# Patient Record
Sex: Female | Born: 1935 | Race: Asian | Hispanic: No | Smoking: Never smoker
Health system: Southern US, Community
[De-identification: ages and names within clinical notes are randomized; demographics above are authoritative.]

## PROBLEM LIST (undated history)

## (undated) DIAGNOSIS — M1990 Unspecified osteoarthritis, unspecified site: Secondary | ICD-10-CM

## (undated) HISTORY — PX: JOINT REPLACEMENT: SHX530

---

## 2007-03-21 ENCOUNTER — Inpatient Hospital Stay (HOSPITAL_COMMUNITY): Admission: RE | Admit: 2007-03-21 | Discharge: 2007-03-25 | Payer: Self-pay | Admitting: Orthopedic Surgery

## 2008-10-18 ENCOUNTER — Encounter: Admission: RE | Admit: 2008-10-18 | Discharge: 2008-11-01 | Payer: Self-pay | Admitting: Orthopedic Surgery

## 2008-11-14 ENCOUNTER — Ambulatory Visit (HOSPITAL_COMMUNITY): Admission: EM | Admit: 2008-11-14 | Discharge: 2008-11-14 | Payer: Self-pay | Admitting: Emergency Medicine

## 2010-04-16 LAB — POCT I-STAT, CHEM 8
Creatinine, Ser: 0.5 mg/dL (ref 0.4–1.2)
Glucose, Bld: 101 mg/dL — ABNORMAL HIGH (ref 70–99)
Hemoglobin: 13.9 g/dL (ref 12.0–15.0)
Potassium: 4.1 mEq/L (ref 3.5–5.1)

## 2010-05-27 NOTE — Discharge Summary (Signed)
Tammie Willis, Tammie Willis          ACCOUNT NO.:  [id number]   MEDICAL RECORD NO.:  [id number]          PATIENT TYPE:  INP   LOCATION:  5037                         FACILITY:  [facility]   PHYSICIAN:  Harvie Junior, M.D.   DATE OF BIRTH:  1929-10-30   DATE OF ADMISSION:  03/21/2007  DATE OF DISCHARGE:  03/24/2007                               DISCHARGE SUMMARY   ADMITTING DIAGNOSES:  1. End-stage severe degenerative joint disease, right knee.  2. [place]  3. Moderate obesity.   DISCHARGE DIAGNOSES:  1. End-stage severe degenerative joint disease, right knee.  2. Status post [place] arthro[place]  3. Moderate obesity.  4. Preoperative urinary tract infection, mild.   PROCEDURES IN-HOSPITAL:  Right total knee arthroplasty, computer-  assisted, Jodi Geralds, M.D. March 21, 2007.   BRIEF HISTORY:  Ms. Teachey is a 77 year old female who presents our  office with a chief complaint of severe right knee pain, catching and  locking.  She had pain at night and pain with weightbearing.  We  obtained x-rays of the right knee which showed severe end-stage bone-on-  bone arthritis with marked osteophyte formation and spurring, with  subluxation of her knee joint.  She had had her left knee repaired, via  knee replacement in [place], where she is from, about 3 years ago and did  well with that.  It was felt based upon her clinical and radiographic  findings that she was a candidate for a right total knee arthroplasty,  and she is admitted for this.   PERTINENT LABORATORY STUDIES:  Hemoglobin on admission was 15.2,  hematocrit 44.4, WBCs 6.5.  On post-op day #1, her hemoglobin 11.3 with  hematocrit 33.0.  Post-op day #2, her hemoglobin was 9.7, hematocrit  28.7.  On post-op day #3 her hemoglobin was 9.9 with hematocrit 28.4.  BMET on admission showed no abnormalities, and this was repeated on post-  op day #1  and was within normal limits.  Pro-time on admission was 11.9  seconds with a INR of 0.9 and a PTT of 28, on Coumadin therapy daily.  Postoperatively, on the day of discharge, her pro-time was 22.0 seconds  with INR of 1.9.  Urinalysis on admission showed few bacteria with 3-6  WBCs per high-powered field.  No other abnormalities noted.  CMET on  admission showed no abnormalities.   HOSPITAL COURSE:  The patient and was taken to the operating room on  March 21, 2007 and underwent a right total knee arthroplasty, computer-  assisted as well, described in Dr. Luiz Blare' operative note.  Preoperatively, she was given 80 mg IV of gentamicin and 1 gram Ancef.  This was felt to take care of the few bacteria noted on her preoperative  urinalysis and to take care for knee prophylactically.  Postoperatively,  she was given IV fluids, put on a PCA morphine pump.  Physical therapy  was ordered for walker ambulation, weightbearing as tolerated the right,  and a CPM machine was used for right knee range of motion.  Oral  Percocet was ordered for pain control, and she was started on Coumadin,  anti-thrombolytic leg therapy per pharmacy protocol for DVT prophylaxis,  and she will need this x1 month.  On post-op day #1, she overall was  doing well.  She had moderate knee pain, was relieved with PCA morphine.  She got out of bed to the patient the chair with physical therapy and  was able to walk some in  the room.  On post-op day #2, her hemoglobin  was stable.  Her left knee dressing was changed, and the Hemovac drain  was pulled.  Her IV was converted to a saline lock, and her PCA morphine  pump was discontinued.  She continued to progress with physical therapy,  and on the date of discharge, which is March 24, 2007, she was afebrile  and her vital signs were stable.  Her left knee wound was benign.  Her  calf was soft and __________ was intact distally.  Her knee range of  motion was 0 degrees to 60  degrees on.  Her INR the day of discharge was  1.9 on Coumadin therapy.  She is discharged to a skilled nursing  facility.   ACTIVITY:  Her activity status to be weightbearing as tolerated on the  right, with a walker.   DIET:  She improved condition, will be on a regular diet.  She will need  daily physical therapy for walker ambulation, weightbearing as tolerated  on the right.  She will need a CPM machine used 8 hours per 24 hours x1  week, progressing 10 degrees daily up to 95 or 100 degrees.  She will  start at zero degrees to 60 degrees.  She will also need physical  therapy for aggressive range of motion of the right knee as tolerated.   DISCHARGE MEDICATIONS:  Medications at discharge will be:  1. Percocet 5 mg 1-2 tablets q.4 h. p.r.n. pain.  2. Coumadin per pharmacy protocol 1 tablet daily x1 month post-op for      DVT prophylaxis, shooting for an INR of 2.0.  She will need to wear      the knee immobilizer when in bed for about 5 days.  They can      discontinue it.  She will need her dressing changed to the right      knee every other day, and the wound painted with Betadine.  She      will need a follow up with Dr. Luiz Blare in his office, in      approximately 12 days, i.e. when she is 2 weeks post-op.   CONDITION ON DISCHARGE:  Improved.   Our office number if any to be contacted is [phone].      Marshia Ly, P.A.      Harvie Junior, M.D.  Electronically Signed    JB/MEDQ  D:  03/24/2007  T:  03/25/2007  Job:  [id number]

## 2010-05-27 NOTE — Op Note (Signed)
[name], Tammie Willis          ACCOUNT NO.:  [id number]   MEDICAL RECORD NO.:  [id number]          PATIENT TYPE:  INP   LOCATION:  5037                         FACILITY:  [facility]   PHYSICIAN:  Harvie Junior, M.D.   DATE OF BIRTH:  [birth year]   DATE OF PROCEDURE:  03/21/2007  DATE OF DISCHARGE:                               OPERATIVE REPORT   PREOPERATIVE DIAGNOSIS:  Severe end-stage degenerative joint disease of  right knee with severe malalignment.   POSTOPERATIVE DIAGNOSIS:  Same as above.   PROCEDURE:  Right total knee replacement with [employer] systems size 2  femur, size 2 tibia, 12.5 mm bridging bearing, and a 32 mm all  polyethylene patella.   SURGEON:  Harvie Junior, M.D.   ASSISTANTDeno Lunger.   ANESTHESIA:  General.   SECOND OPERATIVE PROCEDURE:  Computer-assisted right total knee  replacement.   SURGEON:  Harvie Junior, M.D.   ASSISTANTDeno Lunger.   ANESTHESIA:  General.   BRIEF HISTORY:  [name] is a 77 year old female with a long  history of having had severe bilateral degenerative joint disease.  She  underwent a left total knee in [place] some time back and has done  reasonably well with that.  She still is essentially wheelchair bound  relative to the severe nature of her deformity and pain in the right  knee, and because of continual complaints of pain, she was evaluated in  the office where she is noted to have severe end-stage degenerative  joint disease.  We talked about the treatment options and ultimately  felt that the most appropriate course of action probably was going to be  total knee replacement.  She was brought to the operating room for this  procedure.   PROCEDURE:  The patient was brought to the operating room and after  adequate anesthesia was obtained by general anesthetic, the patient was  placed on the operating table.  The right leg was prepped and draped in  the usual sterile fashion.  Following this, the knee was  exsanguinated,  and a blood pressure tourniquet was insufflated to 350 mmHg.  Following  this, a midline incision was made through subcutaneous tissue and taken  down to the level of the extensor mechanism.  Medial parapatellar  thrombus was undertaken.Medial and lateral meniscectomy as well as  anterior and posterior cruciate excision, retropatellar fat pad excision  were undertaken.  Once this was completed, attention was turned to the  computer-assistance modules  where two pins were placed in the tibia,  two pins in the femur, and the arrays were placed.  Once this was  completed, the attention was turned towards a long alignment.  The  patient at this point could be brought into neutral long alignment.  I  was not easily able to get her into valgus.  At that point, a larger  meatal release was undertaken, and once that was done, we could easily  get her into some valgus alignment.  At this point, attention was turned  toward the tibia, which was cut perpendicular to its long axis with the  computer-assistance modules.  The modules were placed, and the  registration process was undertaken.  Prior to this, it takes about 20  minutes for this surgery.  Once that was completed, the tibia was cut  perpendicular to its long axis, femur perpendicular to its long axis,  and then spacer blocks were put in place.  We could easily get a 10  spacer block into that extension gap.  Once that was completed,  attention was turned towards the sizing of the femur, and the femur was  sized to a size 2, and once thatwas completed the femur was then cut.  The anterior and posterior cuts were made to a size 2, and once that was  completed, the attention was turned towards the anterior and posterior  chamfers,  which were made as well with the box cut.  Attention was then  turned to the tibia which is sized to a 2.  It was rotationally aligned,  and it was keeled and drilled, and once that was completed,  the  attention was turned to the patella, which was cut perpendicular down to  a level of 14, and then a 32 was chosen.  It really was a tight fit for  a 32.  Obviously, did not have anything smaller, so that was used, and  it did relatively fit, but there was a little bit of overhang on both  the inferior and superior areas, but the pegs were well placed and was  going to be good for cement mantle.  Once this was completed, all the  trial components were removed out of the knee.  The knee was copiously  and thoroughly lavaged and suctioned dry and then sponges were used to  dry thoroughly and completely.  The cement was mixed on the back table,  and the final components were cemented into place.  A size 2 femur, a  size 2 tibia, a 10 mm bridging bearing, and a 32 mm all polyethylene  patella.  Once this was completed and the cement was allowed to harden  the tourniquet was let down, and the final poly was put into place.  Through flexion and extension, there was a little bit of a tendency  towards subluxation which is very concerning given the significant  rotational malalignment that she had preoperatively.  At this point, we  had trialed back and forth from a 10 to a 12.5.  It felt like the 10 was  going to be fine, but at this point with the rotary subluxation and  concern for that, we did take out some of our stitches of her medial  parapatellar arthrotomy and then change the 10 poly to a 12.5 poly.  This gave a little bit less of an anterior drawer, and I was not able to  get a rotational subluxation with this higher level of poly.  This did  leave her about 2 degrees shy of full extension, but I felt that this  was critical given the severe rotational issue that she had going into  her surgery.  Computer assistance showed [place] that we had perfect neutral  long alignment.  Gap balances were then a millimeter, and extension and  flexion were balanced.  At this point, the knee was  copiously and  thoroughly irrigated and suctioned dry.  All bleeding had been  controlled with electrocautery, and the tourniquet had been let down.  The knee was then closed with 1 Vicryl running in the medial  parapatellar arthrotomy, 2-0 Vicryl  in the subcu, and then skin staples.  A sterile compressive dressing was applied as well as a knee  immobilizer.  The patient was taken to the recovery room and was noted  to be in satisfactory condition.  The estimated blood loss for the  procedure was none.      Harvie Junior, M.D.  Electronically Signed     JLG/MEDQ  D:  03/21/2007  T:  03/22/2007  Job:  [id number]

## 2010-09-02 ENCOUNTER — Inpatient Hospital Stay (INDEPENDENT_AMBULATORY_CARE_PROVIDER_SITE_OTHER)
Admission: RE | Admit: 2010-09-02 | Discharge: 2010-09-02 | Disposition: A | Discharge status: Home / Self Care | Payer: Self-pay | Source: Ambulatory Visit | Attending: Family Medicine | Admitting: Family Medicine

## 2010-09-02 ENCOUNTER — Ambulatory Visit (INDEPENDENT_AMBULATORY_CARE_PROVIDER_SITE_OTHER): Payer: Self-pay

## 2010-09-02 DIAGNOSIS — J069 Acute upper respiratory infection, unspecified: Secondary | ICD-10-CM

## 2010-10-06 LAB — URINE MICROSCOPIC-ADD ON

## 2010-10-06 LAB — CBC
HCT: 28.4 — ABNORMAL LOW
HCT: 28.7 — ABNORMAL LOW
Hemoglobin: 11.3 — ABNORMAL LOW
Hemoglobin: 15.2 — ABNORMAL HIGH
Hemoglobin: 9.7 — ABNORMAL LOW
Hemoglobin: 9.9 — ABNORMAL LOW
MCHC: 34.2
MCHC: 34.4
MCV: 90.5
Platelets: 181
RBC: 3.14 — ABNORMAL LOW
RBC: 3.17 — ABNORMAL LOW
RBC: 3.64 — ABNORMAL LOW
RDW: 13.1
RDW: 13.2
WBC: 6.7
WBC: 9.7

## 2010-10-06 LAB — COMPREHENSIVE METABOLIC PANEL
ALT: 18
BUN: 13
Calcium: 9.7
Glucose, Bld: 99
Sodium: 138
Total Protein: 6.6

## 2010-10-06 LAB — BASIC METABOLIC PANEL
CO2: 28
Calcium: 8.8
Chloride: 104
GFR calc Af Amer: >60
Sodium: 139

## 2010-10-06 LAB — PROTIME-INR
INR: 0.9
INR: 1
INR: 2 — ABNORMAL HIGH
Prothrombin Time: 23.2 — ABNORMAL HIGH

## 2010-10-06 LAB — DIFFERENTIAL
Lymphocytes Relative: 31
Lymphs Abs: 2
Monocytes Relative: 9
Neutro Abs: 3.7
Neutrophils Relative %: 57

## 2010-10-06 LAB — URINALYSIS, ROUTINE W REFLEX MICROSCOPIC
Nitrite: NEGATIVE
Specific Gravity, Urine: 1.02
Urobilinogen, UA: 0.2

## 2010-10-06 LAB — ABO/RH: ABO/RH(D): AB POS

## 2010-10-06 LAB — APTT: aPTT: 28

## 2010-10-12 ENCOUNTER — Inpatient Hospital Stay (INDEPENDENT_AMBULATORY_CARE_PROVIDER_SITE_OTHER)
Admission: RE | Admit: 2010-10-12 | Discharge: 2010-10-12 | Disposition: A | Discharge status: Home / Self Care | Payer: Self-pay | Source: Ambulatory Visit | Attending: Emergency Medicine | Admitting: Emergency Medicine

## 2010-10-12 ENCOUNTER — Other Ambulatory Visit (HOSPITAL_COMMUNITY): Payer: Self-pay | Admitting: Emergency Medicine

## 2010-10-12 ENCOUNTER — Ambulatory Visit (HOSPITAL_COMMUNITY)
Admission: RE | Admit: 2010-10-12 | Discharge: 2010-10-12 | Disposition: A | Discharge status: Home / Self Care | Payer: Medicare Other | Source: Ambulatory Visit | Attending: Emergency Medicine | Admitting: Emergency Medicine

## 2010-10-12 DIAGNOSIS — R51 Headache: Secondary | ICD-10-CM

## 2010-10-12 DIAGNOSIS — R220 Localized swelling, mass and lump, head: Secondary | ICD-10-CM | POA: Insufficient documentation

## 2012-04-13 ENCOUNTER — Other Ambulatory Visit: Payer: Self-pay | Admitting: Cardiovascular Disease

## 2012-04-13 ENCOUNTER — Ambulatory Visit
Admission: RE | Admit: 2012-04-13 | Discharge: 2012-04-13 | Disposition: A | Discharge status: Home / Self Care | Payer: Medicare Other | Source: Ambulatory Visit | Attending: Cardiovascular Disease | Admitting: Cardiovascular Disease

## 2012-04-13 DIAGNOSIS — R52 Pain, unspecified: Secondary | ICD-10-CM

## 2012-04-18 ENCOUNTER — Other Ambulatory Visit: Payer: Self-pay | Admitting: Cardiovascular Disease

## 2012-04-19 ENCOUNTER — Ambulatory Visit
Admission: RE | Admit: 2012-04-19 | Discharge: 2012-04-19 | Disposition: A | Discharge status: Home / Self Care | Payer: Medicare Other | Source: Ambulatory Visit | Attending: Cardiovascular Disease | Admitting: Cardiovascular Disease

## 2014-08-05 IMAGING — CR DG CHEST 2V
2 series · 2 of 2 positions shown · non-contrast
Comparison: Chest x-ray of 09/02/2010

CLINICAL DATA: Chest pain, history of cardiac disease

CHEST - 2 VIEW

[w chest pa]
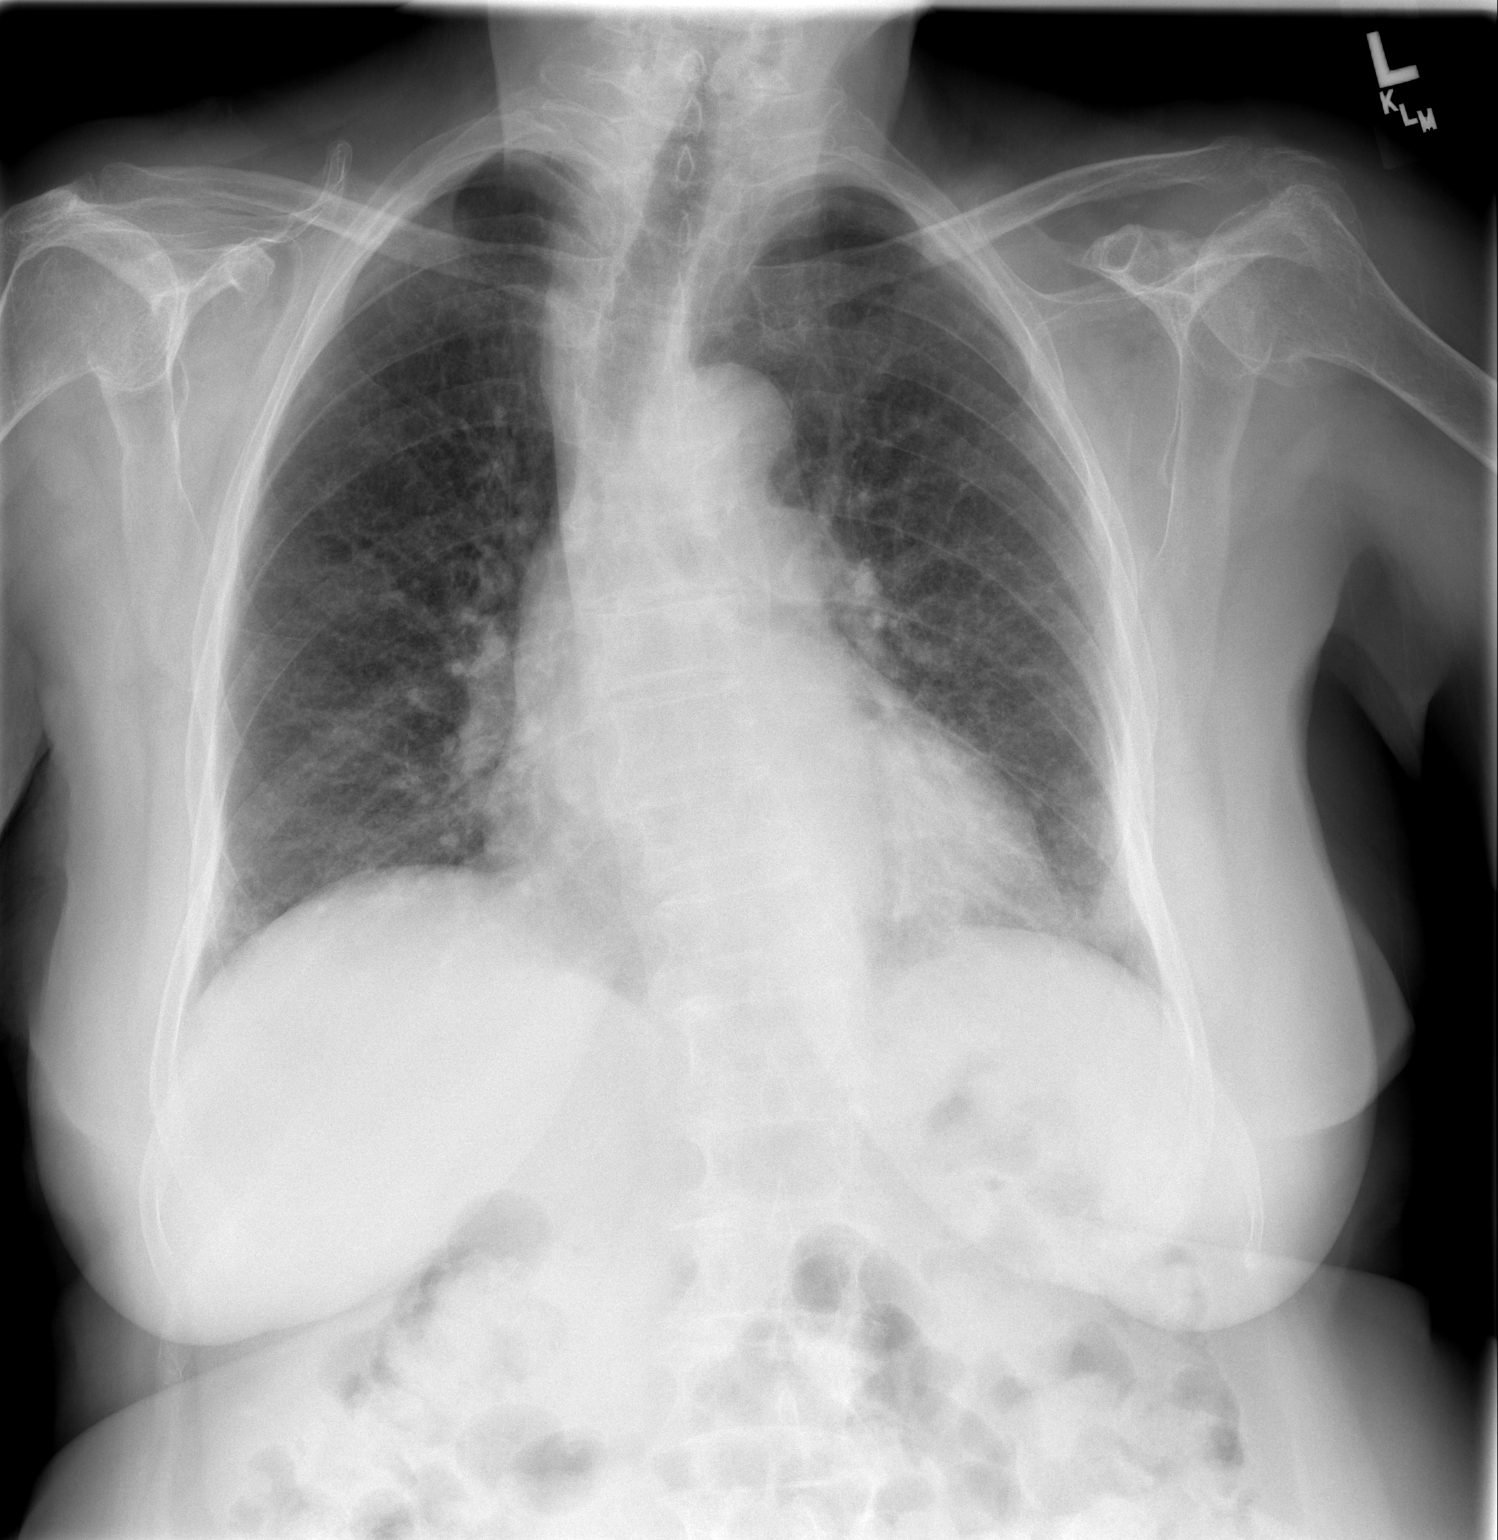

[w chest lat]
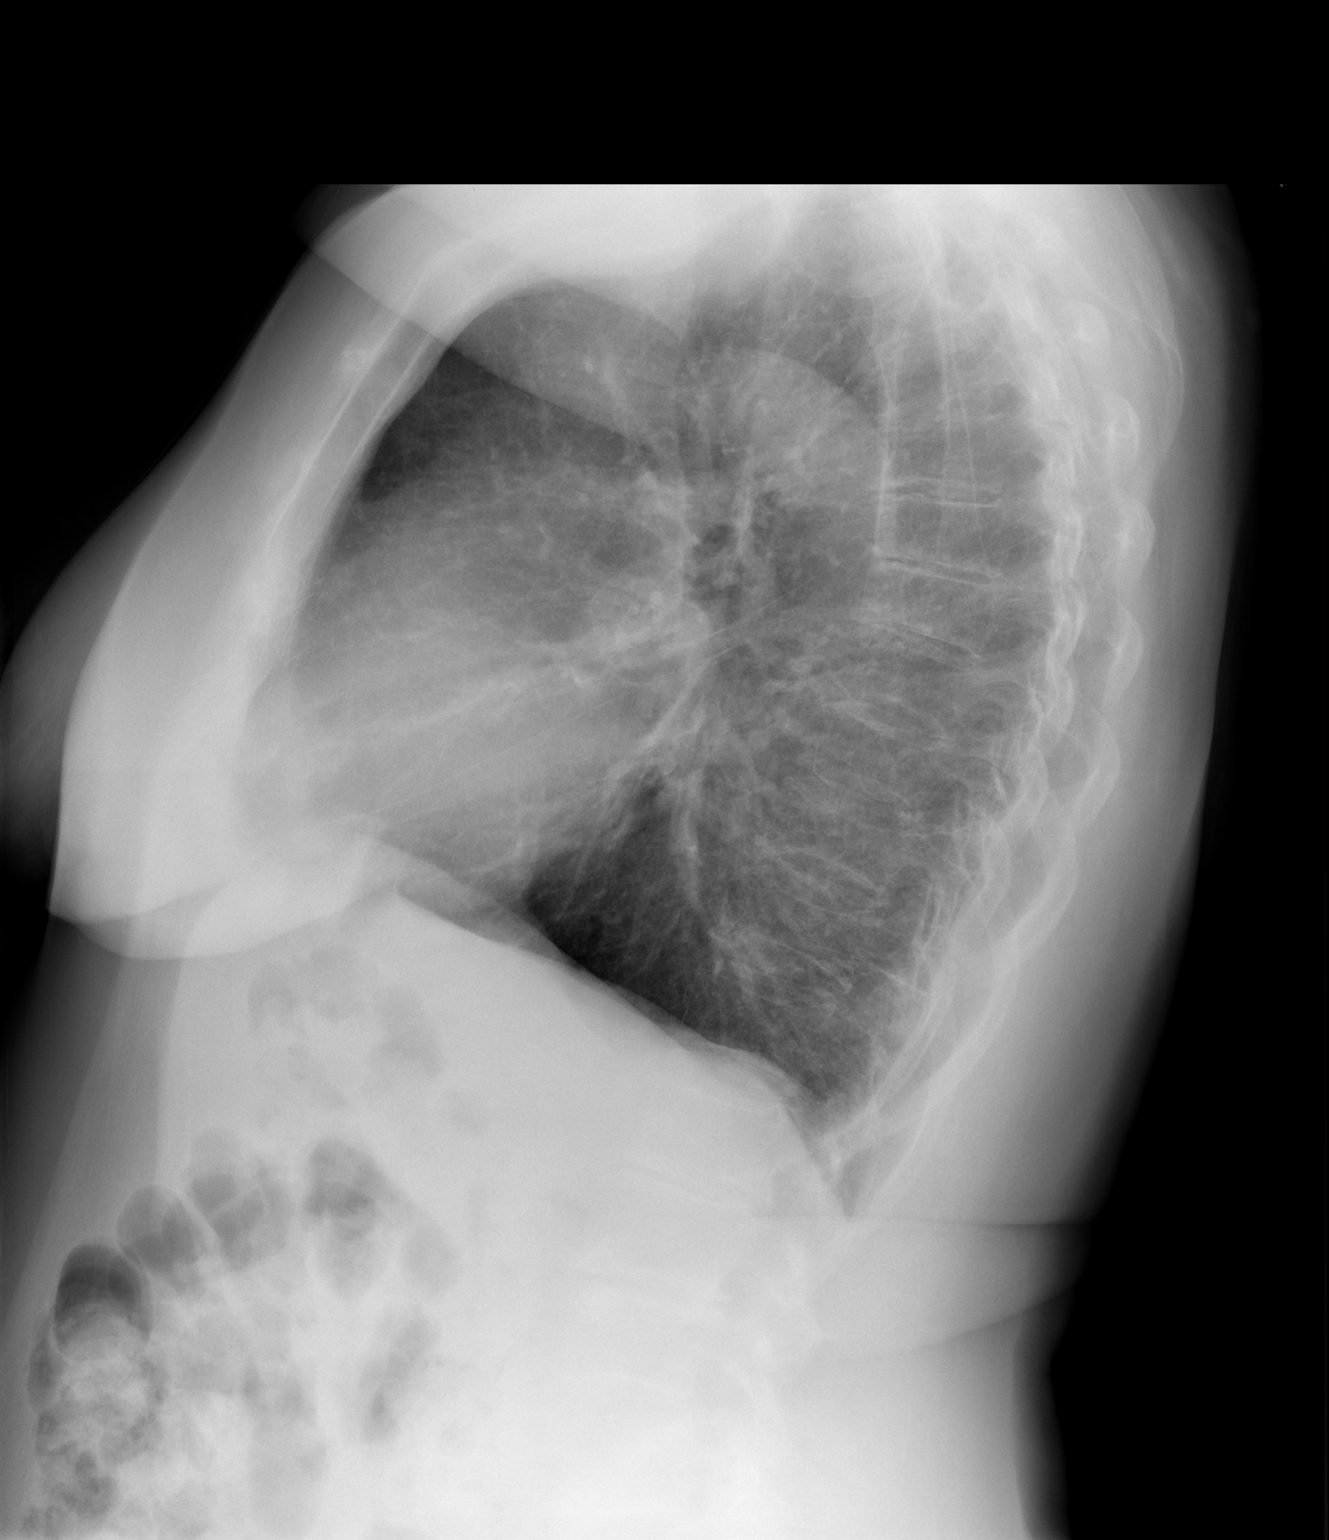

[2 of 2 positions shown; findings below may reference images not displayed]

FINDINGS: The lungs are hyperaerated with increased AP diameter and
flattened hemidiaphragms consistent with emphysema.  No focal
infiltrate or effusion is seen.  There may be minimal pulmonary
vascular congestion present and there is cardiomegaly noted.  The
bones are osteopenic.
IMPRESSION: 1.  Emphysema.
2.  Cardiomegaly.  Question minimal pulmonary vascular congestion.

## 2014-08-11 IMAGING — CT CT HEAD W/O CM
2 series · 16 of 30 positions shown, 18 images · non-contrast
Comparison: CT brain scan of 10/12/2010

CLINICAL DATA: Dull left-sided headaches for 3 months, hit head on
door 5-6 months ago

CT HEAD WITHOUT CONTRAST
TECHNIQUE: Contiguous axial images were obtained from the base of
the skull through the vertex without contrast.

[Series 3: head bone · axial · 0.49mm/px · z∈[+15,+130]mm · 8 of 56 slices shown]
[im 6/56  bone]
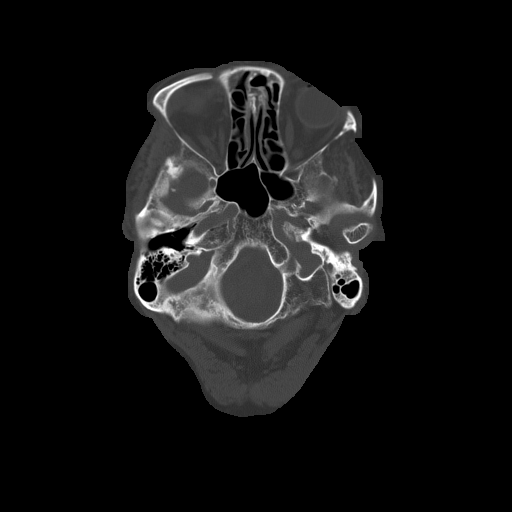
[im 12/56  bone]
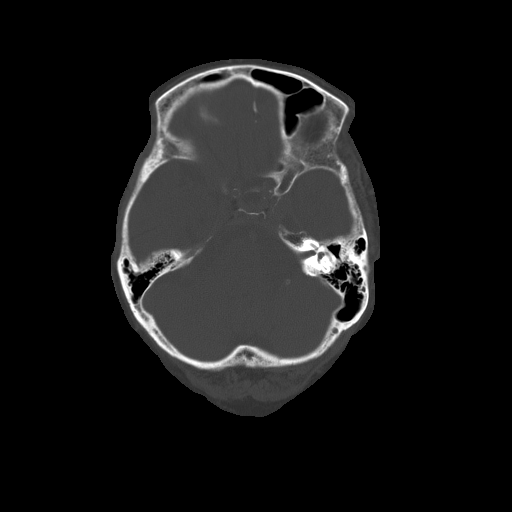
[im 18/56  bone]
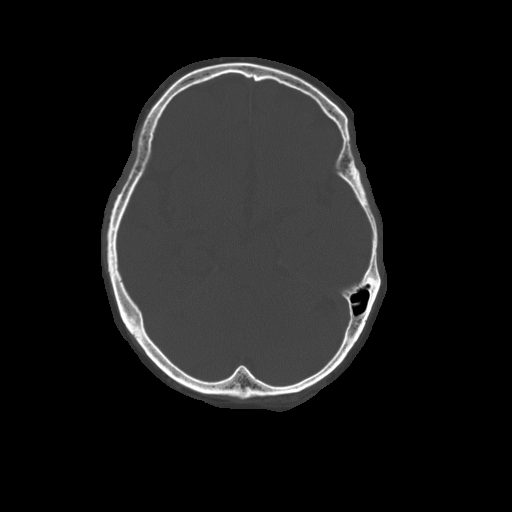
[im 24/56  bone]
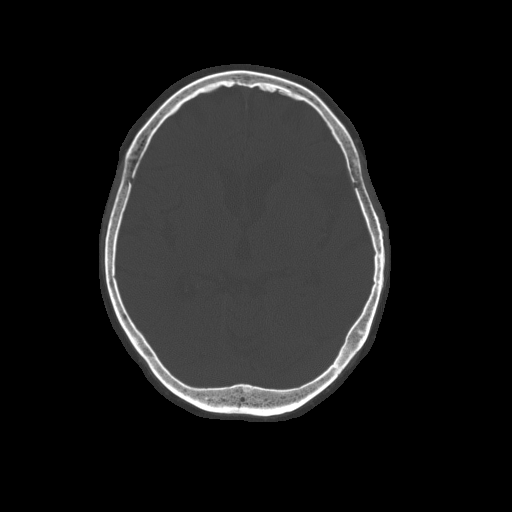
[im 32/56  bone]
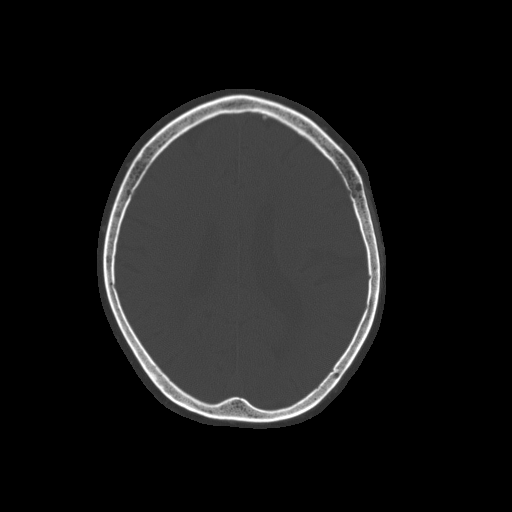
[im 38/56  bone]
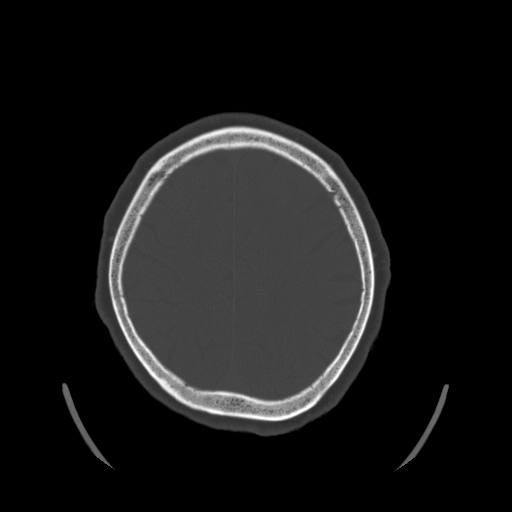
[im 44/56  bone]
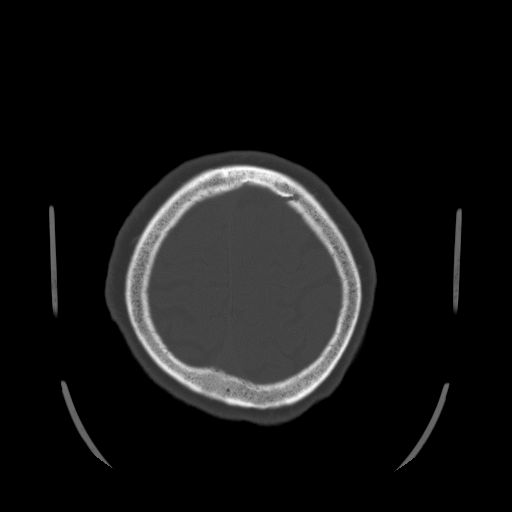
[im 50/56  bone]
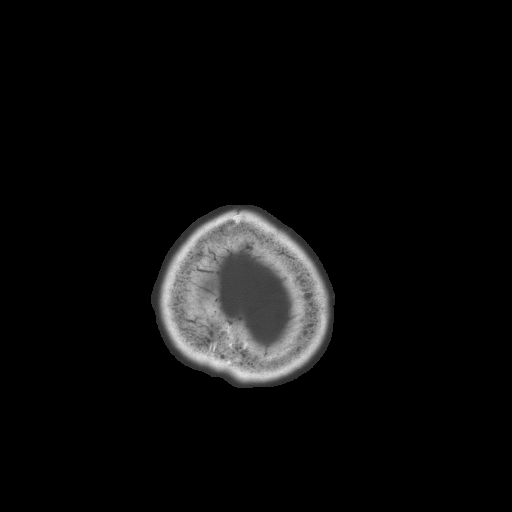

[Series 32: 3d filtered head w/o · axial · non-contrast · 0.49mm/px · z∈[+19,+129]mm · 8 of 28 slices shown, 10 images]
[im 4/28  brain]
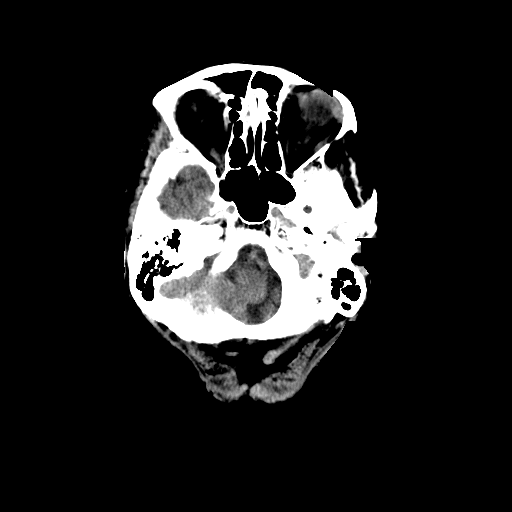
[im 4/28  bone]
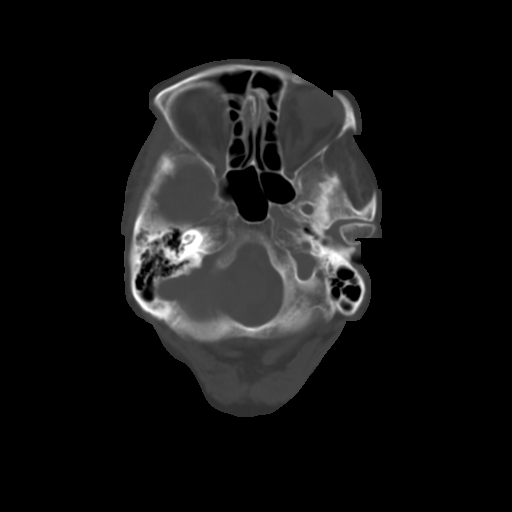
[im 7/28  brain]
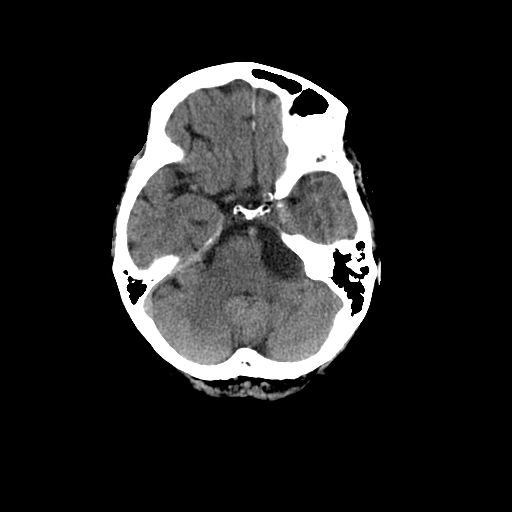
[im 10/28  brain]
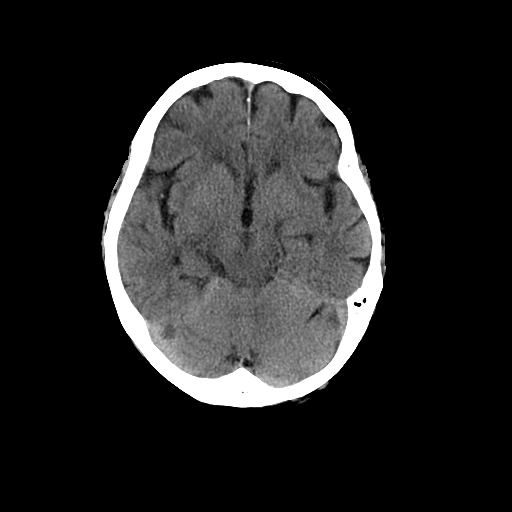
[im 13/28  brain]
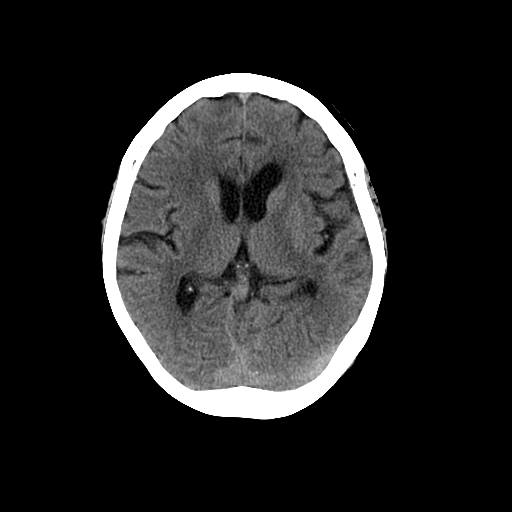
[im 16/28  brain]
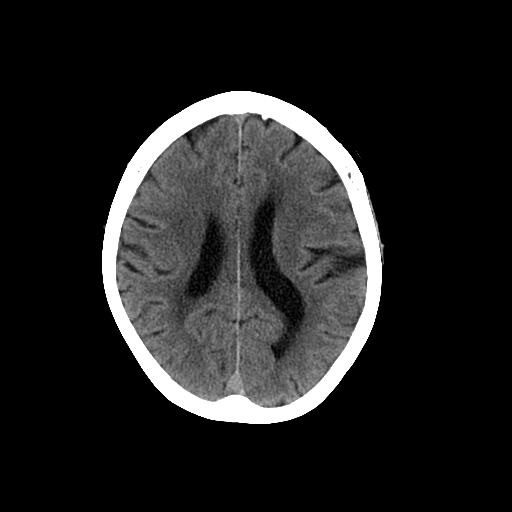
[im 16/28  bone]
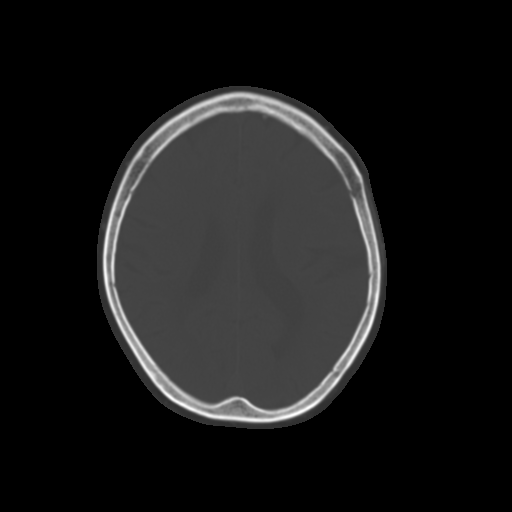
[im 19/28  brain]
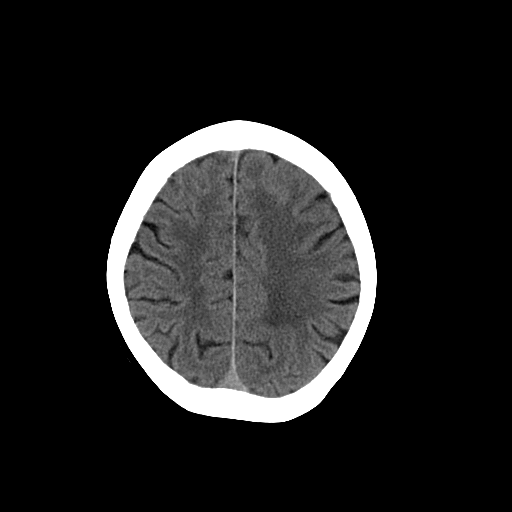
[im 22/28  brain]
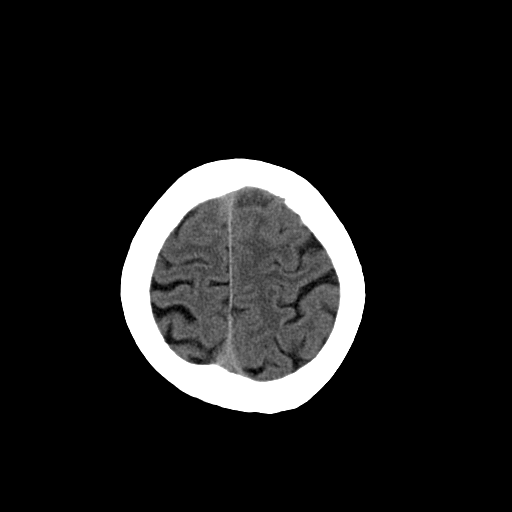
[im 25/28  brain]
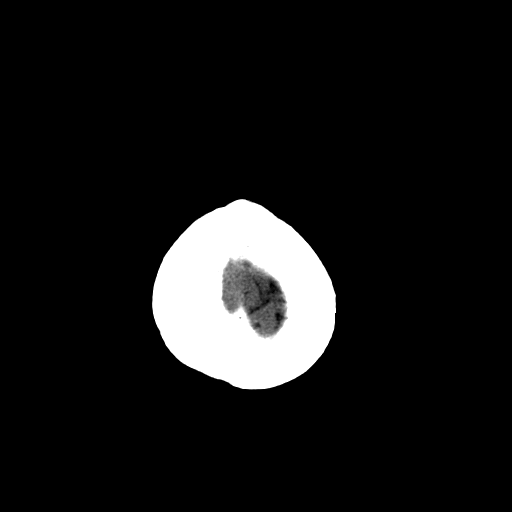

[16 of 30 positions shown; findings below may reference images not displayed]

FINDINGS: The cerebellopontine angle low attenuation mass on the
left noted previously has not changed, measuring 15 mm compared to
14 mm previously.  The ventricular system remains prominent as are
the cortical sulci and cerebellar folia consistent with diffuse
atrophy.  The septum remains midline in position.  No hemorrhage,
mass lesion, or acute infarction is seen.  Small vessel ischemic
change is again noted throughout the periventricular white matter.
On bone window images, no calvarial abnormality is seen.  The
paranasal sinuses that are visualized appear pneumatized.
IMPRESSION: 1.  No acute intracranial abnormality.  Diffuse atrophy and small
vessel disease.
2.  Stable low attenuation mass in the left cerebellar - pontine
angle, as previously described.

## 2015-08-06 ENCOUNTER — Encounter (HOSPITAL_COMMUNITY): Payer: Self-pay | Admitting: Emergency Medicine

## 2015-08-06 ENCOUNTER — Ambulatory Visit (HOSPITAL_COMMUNITY)
Admission: EM | Admit: 2015-08-06 | Discharge: 2015-08-06 | Disposition: A | Discharge status: Home / Self Care | Payer: Medicare Other | Attending: Family Medicine | Admitting: Family Medicine

## 2015-08-06 DIAGNOSIS — M542 Cervicalgia: Secondary | ICD-10-CM

## 2015-08-06 HISTORY — DX: Unspecified osteoarthritis, unspecified site: M19.90

## 2015-08-06 MED ORDER — DICLOFENAC SODIUM 1 % TD GEL: 4.0000 g | Freq: Four times a day (QID) | TRANSDERMAL | 1 refills | Status: AC

## 2015-08-06 NOTE — Discharge Instructions (Signed)
Use medicine as needed, see your doctor if further problems. °

## 2015-08-06 NOTE — ED Triage Notes (Signed)
PT has right sided neck for approx. 2 months

## 2015-08-06 NOTE — ED Provider Notes (Signed)
[facility]    CSN: [id number] Arrival date & time: 08/06/15  1518  First Provider Contact:  None       History   Chief Complaint No chief complaint on file.   HPI Tammie Willis is a 82 y.o. female.    Headache  Pain location:  Frontal Quality:  Dull Radiates to:  Does not radiate Duration:  1 week Chronicity:  New Similar to prior headaches: no   Context comment:  Bump in scalp noticed. Relieved by:  None tried Worsened by:  Nothing Ineffective treatments:  None tried Associated symptoms: neck pain   Associated symptoms: no abdominal pain, no back pain, no blurred vision, no dizziness, no drainage and no swollen glands     No past medical history on file.  There are no active problems to display for this patient.   No past surgical history on file.  OB History    No data available       Home Medications    Prior to Admission medications   Not on File    Family History No family history on file.  Social History Social History  Substance Use Topics  . Smoking status: Not on file  . Smokeless tobacco: Not on file  . Alcohol use Not on file     Allergies   Review of patient's allergies indicates not on file.   Review of Systems Review of Systems  Constitutional: Negative.   HENT: Negative for postnasal drip.   Eyes: Negative for blurred vision.  Gastrointestinal: Negative for abdominal pain.  Musculoskeletal: Positive for neck pain. Negative for back pain.  Neurological: Negative for dizziness and headaches.  All other systems reviewed and are negative.    Physical Exam Triage Vital Signs ED Triage Vitals [08/06/15 1619]  Enc Vitals Group     BP 155/76     Pulse Rate 60     Resp 18     Temp 97.6 F (36.4 C)     Temp Source Oral     SpO2 95 %     Weight 140 lb (63.5 kg)     Height 4\' 10"  (1.473 m)     Head Circumference      Peak Flow      Pain Score      Pain Loc      Pain Edu?      Excl. in GC?    No  data found.   Updated Vital Signs BP 155/76 (BP Location: Right Arm)   Pulse 60   Temp 97.6 F (36.4 C) (Oral)   Resp 18   Ht 4\' 10"  (1.473 m)   Wt 140 lb (63.5 kg)   SpO2 95%   BMI 29.26 kg/m   Visual Acuity Right Eye Distance:   Left Eye Distance:   Bilateral Distance:    Right Eye Near:   Left Eye Near:    Bilateral Near:     Physical Exam  Constitutional: She appears well-developed and well-nourished.  HENT:  Head: Normocephalic.  Scalp cyst present  Neurological: She is alert.  Skin: Skin is warm and dry.  Right wrist tendon cyst,     UC Treatments / Results  Labs (all labs ordered are listed, but only abnormal results are displayed) Labs Reviewed - No data to display  EKG  EKG Interpretation None       Radiology No results found.  Procedures Procedures (including critical care time)  Medications Ordered in UC Medications - No  data to display   Initial Impression / Assessment and Plan / UC Course  I have reviewed the triage vital signs and the nursing notes.  Pertinent labs & imaging results that were available during my care of the patient were reviewed by me and considered in my medical decision making (see chart for details).  Clinical Course      Final Clinical Impressions(s) / UC Diagnoses   Final diagnoses:  None    New Prescriptions New Prescriptions   No medications on file     Linna Hoff, MD 08/06/15 1645
